# Patient Record
Sex: Female | Born: 1975 | Hispanic: No | Marital: Married | Smoking: Current every day smoker
Health system: Southern US, Community
[De-identification: ages and names within clinical notes are randomized; demographics above are authoritative.]

## PROBLEM LIST (undated history)

## (undated) DIAGNOSIS — R569 Unspecified convulsions: Secondary | ICD-10-CM

---

## 2002-06-05 ENCOUNTER — Encounter: Payer: Self-pay | Admitting: Emergency Medicine

## 2002-06-05 ENCOUNTER — Emergency Department (HOSPITAL_COMMUNITY): Admission: EM | Admit: 2002-06-05 | Discharge: 2002-06-05 | Payer: Self-pay | Admitting: Emergency Medicine

## 2004-04-14 ENCOUNTER — Other Ambulatory Visit: Admission: RE | Admit: 2004-04-14 | Discharge: 2004-04-14 | Payer: Self-pay | Admitting: Gynecology

## 2004-10-19 ENCOUNTER — Inpatient Hospital Stay (HOSPITAL_COMMUNITY): Admission: AD | Admit: 2004-10-19 | Discharge: 2004-10-22 | Payer: Self-pay | Admitting: Obstetrics

## 2018-07-16 ENCOUNTER — Emergency Department (HOSPITAL_COMMUNITY): Payer: Self-pay

## 2018-07-16 ENCOUNTER — Emergency Department (HOSPITAL_COMMUNITY)
Admission: EM | Admit: 2018-07-16 | Discharge: 2018-07-16 | Disposition: A | Discharge status: Home / Self Care | Payer: Self-pay | Attending: Emergency Medicine | Admitting: Emergency Medicine

## 2018-07-16 ENCOUNTER — Encounter (HOSPITAL_COMMUNITY): Payer: Self-pay | Admitting: Emergency Medicine

## 2018-07-16 ENCOUNTER — Other Ambulatory Visit: Payer: Self-pay

## 2018-07-16 DIAGNOSIS — F17210 Nicotine dependence, cigarettes, uncomplicated: Secondary | ICD-10-CM | POA: Insufficient documentation

## 2018-07-16 DIAGNOSIS — G40909 Epilepsy, unspecified, not intractable, without status epilepticus: Secondary | ICD-10-CM | POA: Insufficient documentation

## 2018-07-16 DIAGNOSIS — R569 Unspecified convulsions: Secondary | ICD-10-CM

## 2018-07-16 HISTORY — DX: Unspecified convulsions: R56.9

## 2018-07-16 LAB — URINALYSIS, ROUTINE W REFLEX MICROSCOPIC
BACTERIA UA: NONE SEEN
Bilirubin Urine: NEGATIVE
Glucose, UA: NEGATIVE mg/dL
Ketones, ur: 5 mg/dL — AB
Leukocytes, UA: NEGATIVE
NITRITE: NEGATIVE
PH: 5 (ref 5.0–8.0)
Protein, ur: NEGATIVE mg/dL
Specific Gravity, Urine: 1.02 (ref 1.005–1.030)

## 2018-07-16 LAB — CBC WITH DIFFERENTIAL/PLATELET
Abs Immature Granulocytes: 0 10*3/uL (ref 0.0–0.1)
BASOS ABS: 0.1 10*3/uL (ref 0.0–0.1)
BASOS PCT: 1 %
EOS ABS: 0.1 10*3/uL (ref 0.0–0.7)
EOS PCT: 1 %
HEMATOCRIT: 48.6 % — AB (ref 36.0–46.0)
Hemoglobin: 15.5 g/dL — ABNORMAL HIGH (ref 12.0–15.0)
Immature Granulocytes: 0 %
LYMPHS ABS: 1.8 10*3/uL (ref 0.7–4.0)
Lymphocytes Relative: 18 %
MCH: 32.2 pg (ref 26.0–34.0)
MCHC: 31.9 g/dL (ref 30.0–36.0)
MCV: 101 fL — ABNORMAL HIGH (ref 78.0–100.0)
Monocytes Absolute: 0.8 10*3/uL (ref 0.1–1.0)
Monocytes Relative: 8 %
NEUTROS PCT: 72 %
Neutro Abs: 7.3 10*3/uL (ref 1.7–7.7)
PLATELETS: 226 10*3/uL (ref 150–400)
RBC: 4.81 MIL/uL (ref 3.87–5.11)
RDW: 14.3 % (ref 11.5–15.5)
WBC: 10 10*3/uL (ref 4.0–10.5)

## 2018-07-16 LAB — COMPREHENSIVE METABOLIC PANEL
ALK PHOS: 57 U/L (ref 38–126)
ALT: 14 U/L (ref 0–44)
ANION GAP: 10 (ref 5–15)
AST: 22 U/L (ref 15–41)
Albumin: 3.8 g/dL (ref 3.5–5.0)
BILIRUBIN TOTAL: 0.8 mg/dL (ref 0.3–1.2)
BUN: 9 mg/dL (ref 6–20)
CALCIUM: 9.3 mg/dL (ref 8.9–10.3)
CO2: 25 mmol/L (ref 22–32)
Chloride: 105 mmol/L (ref 98–111)
Creatinine, Ser: 0.77 mg/dL (ref 0.44–1.00)
GFR calc Af Amer: >60 mL/min (ref >60)
Glucose, Bld: 111 mg/dL — ABNORMAL HIGH (ref 70–99)
POTASSIUM: 3.6 mmol/L (ref 3.5–5.1)
Sodium: 140 mmol/L (ref 135–145)
TOTAL PROTEIN: 7.1 g/dL (ref 6.5–8.1)

## 2018-07-16 LAB — WET PREP, GENITAL
Clue Cells Wet Prep HPF POC: NONE SEEN
Sperm: NONE SEEN
TRICH WET PREP: NONE SEEN
WBC, Wet Prep HPF POC: NONE SEEN
Yeast Wet Prep HPF POC: NONE SEEN

## 2018-07-16 LAB — I-STAT BETA HCG BLOOD, ED (MC, WL, AP ONLY)

## 2018-07-16 LAB — RAPID URINE DRUG SCREEN, HOSP PERFORMED
Amphetamines: NOT DETECTED
Barbiturates: NOT DETECTED
Benzodiazepines: NOT DETECTED
COCAINE: NOT DETECTED
OPIATES: NOT DETECTED
TETRAHYDROCANNABINOL: POSITIVE — AB

## 2018-07-16 LAB — CBG MONITORING, ED: GLUCOSE-CAPILLARY: 94 mg/dL (ref 70–99)

## 2018-07-16 LAB — LIPASE, BLOOD: LIPASE: 26 U/L (ref 11–51)

## 2018-07-16 LAB — ETHANOL: Alcohol, Ethyl (B): <10 mg/dL (ref <10)

## 2018-07-16 NOTE — ED Triage Notes (Signed)
Pt presents to ED for assessment after having a witnessed seizure, where she fell from standing after arriving home from work, hit the back of her head on the counter, and is now experiencing generalized body pain from the fall.  Patient states hx of 1 other seizure 4 years ago.  Patient states she drinks 3 large malt liquor beverages a day, and her last drink prior to the seizure was the night before.  EMS assessed patient, but she adamantly refused transport last night.  Patient states she does not remember any of this.  Husband speaking over patient and for patient in triage.  When asked if patient felt safe in her relationships patient states "ummm, yeah, I guess".

## 2018-07-16 NOTE — Discharge Instructions (Signed)
1.  No driving until you have been seen by neurology and cleared as safe for driving. 2.  Try to get regular sleep 8 hours a night.  Eat 3-4 small healthy meals throughout the day.  Avoid alcohol, drugs and smoking.  Alcohol and drugs can trigger seizures in people who are possibly more susceptible.  Try to reduce stress as much as possible. 3.  Use the referral number in your discharge instructions to find a family doctor.  You should see a family doctor and also follow-up with neurology.  A [facility] for further seizure evaluation.  Many specialists require a family doctor for referrals and routine care.

## 2018-07-16 NOTE — ED Notes (Signed)
CBG 94 

## 2018-07-16 NOTE — ED Notes (Signed)
Pt alert and oriented in NAD. Pt verbalized understanding of discharge instructions. 

## 2018-07-16 NOTE — ED Provider Notes (Signed)
[facility] EM[facility]GENC[facility] Provider Note   CSN: [id number] Arrival date & time: 07/16/18  1231     History   Chief Complaint Chief Complaint  Patient presents with  . Seizures  . Head Injury    HPI Laura Arias is a 42 y.o. female.  HPI Patient was home from work yesterday evening.  She saw her husband.  Shortly thereafter she had a seizure.  She does not recall events surrounding the seizure.  Her husband reports that she fell in the kitchen and hit the back of her head on the countertop.  She was having seizure activity with shaking of her extremities.  He reports that she was foaming at the mouth.  This lasted approximately 2 minutes.  Patient then reports awakening to the EMS personnel surrounding her.  EMS evaluated the patient and her vital signs were stable.  Her mental status returned to normal.  Refused transport.  She reports that she agreed to come in this morning because she feels extremely fatigued and washed out.  She feels achy.  She did have a seizure episode about 4 years ago.  She reports that she had an MRI and EEG done at that time.  No etiology was identified.  She was not advised to take anticonvulsant medications.  Patient works in [employer].  She apparently has fairly late hours.  She reports that she had not slept very well the previous evening.  She typically drinks about #2 40 ounce beers per day.  She denies she is ever had withdrawal.  She does not get tremor or shaking if she stops drinking.  She reports that does have her make it harder for her to sleep at night.  She reports she has been getting sweats and chills at night.  This is been going on for a number of months.  Thought maybe is because she is perimenopausal.  She reports she might be chilly and get all bundled up in the blankets and then break out in a sweat.  He denies this occurs during the daytime.  She does not have fevers.  She reports she has had a little bit of weight  gain.  Family history: She reports her mother died from "dementia" in her 260s.  Per the patient this dementia was a result of severe depression.  She describes her father as having died of something similar. Past Medical History:  Diagnosis Date  . Seizures (HCC)     There are no active problems to display for this patient.   Past Surgical History:  Procedure Laterality Date  . CESAREAN SECTION       OB History   None      Home Medications    Prior to Admission medications   Not on File    Family History History reviewed. No pertinent family history.  Social History Social History   Tobacco Use  . Smoking status: Current Every Day Smoker    Packs/day: 1.00  . Smokeless tobacco: Never Used  Substance Use Topics  . Alcohol use: Yes    Comment: drinks several large malt liquors a day  . Drug use: Never     Allergies   Codeine   Review of Systems Review of Systems 10 Systems reviewed and are negative for acute change except as noted in the HPI.  Physical Exam Updated Vital Signs BP 113/76   Pulse (!) 57   Temp 98.9 F (37.2 C) (Oral)   Resp (!) 22  LMP 05/31/2018   SpO2 99%   Physical Exam  Constitutional: She is oriented to person, place, and time. She appears well-developed and well-nourished. No distress.  HENT:  Head: Normocephalic and atraumatic.  Right Ear: External ear normal.  Left Ear: External ear normal.  Nose: Nose normal.  Mouth/Throat: Oropharynx is clear and moist.  Bilateral TMs normal.  Very small laceration to the lateral aspect of the right tongue.  Dentition in good condition.  Eyes: Pupils are equal, round, and reactive to light. EOM are normal.  Neck: Neck supple.  C-spine nontender to palpation.  Cardiovascular: Normal rate, regular rhythm, normal heart sounds and intact distal pulses.  Pulmonary/Chest: Effort normal and breath sounds normal.  Abdominal: Soft. She exhibits no distension and no mass. There is no  tenderness. There is no guarding.  Genitourinary:  Genitourinary Comments: Normal external female genitalia.  Speculum cervix normal appearance with small amount of white discharge.  Slight cervical friability and bleeding with small collection.  No cervical motion tenderness.  Uterus nontender no enlargement.  Bilateral adnexa nontender no enlargement.  Musculoskeletal: Normal range of motion. She exhibits no edema or tenderness.  Neurological: She is alert and oriented to person, place, and time. No cranial nerve deficit or sensory deficit. She exhibits normal muscle tone. Coordination normal.  No tremor.  Finger-nose exam normal.  Cognitive function normal.  Skin: Skin is warm and dry.  Psychiatric: She has a normal mood and affect.     ED Treatments / Results  Labs (all labs ordered are listed, but only abnormal results are displayed) Labs Reviewed  COMPREHENSIVE METABOLIC PANEL - Abnormal; Notable for the following components:      Result Value   Glucose, Bld 111 (*)    All other components within normal limits  CBC WITH DIFFERENTIAL/PLATELET - Abnormal; Notable for the following components:   Hemoglobin 15.5 (*)    HCT 48.6 (*)    MCV 101.0 (*)    All other components within normal limits  URINALYSIS, ROUTINE W REFLEX MICROSCOPIC - Abnormal; Notable for the following components:   Hgb urine dipstick MODERATE (*)    Ketones, ur 5 (*)    All other components within normal limits  RAPID URINE DRUG SCREEN, HOSP PERFORMED - Abnormal; Notable for the following components:   Tetrahydrocannabinol POSITIVE (*)    All other components within normal limits  WET PREP, GENITAL  ETHANOL  LIPASE, BLOOD  RPR  HIV ANTIBODY (ROUTINE TESTING)  CBG MONITORING, ED  I-STAT BETA HCG BLOOD, ED ([facility], [facility], [facility] ONLY)  GC/CHLAMYDIA PROBE AMP ([place]) NOT AT [facility]    EKG EKG Interpretation  Date/Time:  Sunday July 16 2018 14:27:28 EDT Ventricular Rate:  58 PR Interval:    QRS  Duration: 90 QT Interval:  426 QTC Calculation: 419 R Axis:   68 Text Interpretation:  Sinus rhythm Short PR interval normal, no old comparison Confirmed by Arby Barrette [phone]) on 07/16/2018 4:09:01 PM   Radiology Dg Chest 2 View  Result Date: 07/16/2018 CLINICAL DATA:  Witnessed seizure. Fall from standing position, hitting back of head. Generalized body pain. EXAM: CHEST - 2 VIEW COMPARISON:  None. FINDINGS: The heart size and mediastinal contours are normal. The lungs are clear. There is no pleural effusion or pneumothorax. No acute osseous findings are identified. There is a mild convex right scoliosis which may be positional. Telemetry leads overlie the chest. IMPRESSION: No active cardiopulmonary process. Electronically Signed   By: Hilarie Fredrickson.D.  On: 07/16/2018 14:35   Ct Head Wo Contrast  Result Date: 07/16/2018 CLINICAL DATA:  Seizure, new, nontraumatic, >40 yrs EXAM: CT HEAD WITHOUT CONTRAST TECHNIQUE: Contiguous axial images were obtained from the base of the skull through the vertex without intravenous contrast. COMPARISON:  None. FINDINGS: Brain: No evidence of acute infarction, hemorrhage, hydrocephalus, extra-axial collection or mass lesion/mass effect. Vascular: No hyperdense vessel or unexpected calcification. Skull: Normal. Negative for fracture or focal lesion. Sinuses/Orbits: No acute finding. Other: None. IMPRESSION: Negative exam. Electronically Signed   By: Norva Pavlov M.D.   On: 07/16/2018 14:30    Procedures Procedures (including critical care time)  Medications Ordered in ED Medications - No data to display   Initial Impression / Assessment and Plan / ED Course  I have reviewed the triage vital signs and the nursing notes.  Pertinent labs & imaging results that were available during my care of the patient were reviewed by me and considered in my medical decision making (see chart for details).     Final Clinical Impressions(s) / ED Diagnoses    Final diagnoses:  Seizure [facility])  Patient does not have diagnosed seizure disorder.  She reports one seizure 4 years ago and describes work-up including MRI and EEG.  He is otherwise healthy.  Patient had witnessed seizure activity by her husband yesterday evening.  He refused transport at that time.  She came in today because of achiness and fatigue.  Clinically she is nontoxic and has normal neurologic exam.  Gnostic work-up is within normal limits.  Patient described chills and sweats at night perception of mild lower abdominal bloating.  Denies significant pain.  Pelvic exam done based on the symptoms.  Normal exam.  No indication at this time of any empiric treatment for pelvic infection.  Cultures will be pending.  Patient does drink several large beers per day.  Her alcohol is negative.  She shows no objective signs of withdrawal.  I doubt alcohol withdrawal seizure given patient has negative blood alcohol level and no signs of withdrawal.  He also occasionally smokes marijuana.  Possibly with a combination of fatigue and absence of abuse lowering the threshold for seizure she has had one seizure in the past.  Patient is counseled that she may not drive.  She is given plan for follow-up with neurology for further evaluation to be cleared for seizure disorder.  ED Discharge Orders        Ordered    Ambulatory referral to Neurology    Comments:  An appointment is requested in approximately: 1 week   07/16/18 1610       Arby Barrette, MD 07/16/18 [phone]

## 2018-07-16 NOTE — ED Notes (Signed)
Patient transported to CT 

## 2018-07-17 LAB — HIV ANTIBODY (ROUTINE TESTING W REFLEX): HIV Screen 4th Generation wRfx: NONREACTIVE

## 2018-07-17 LAB — GC/CHLAMYDIA PROBE AMP (~~LOC~~) NOT AT ARMC
CHLAMYDIA, DNA PROBE: NEGATIVE
Neisseria Gonorrhea: NEGATIVE

## 2018-07-17 LAB — RPR: RPR Ser Ql: NONREACTIVE

## 2019-08-30 IMAGING — CR DG CHEST 2V
2 series · 2 of 2 positions shown · non-contrast
Comparison: None.

CLINICAL DATA: Witnessed seizure. Fall from standing position,
hitting back of head. Generalized body pain.

EXAM:
CHEST - 2 VIEW

[chest pa]
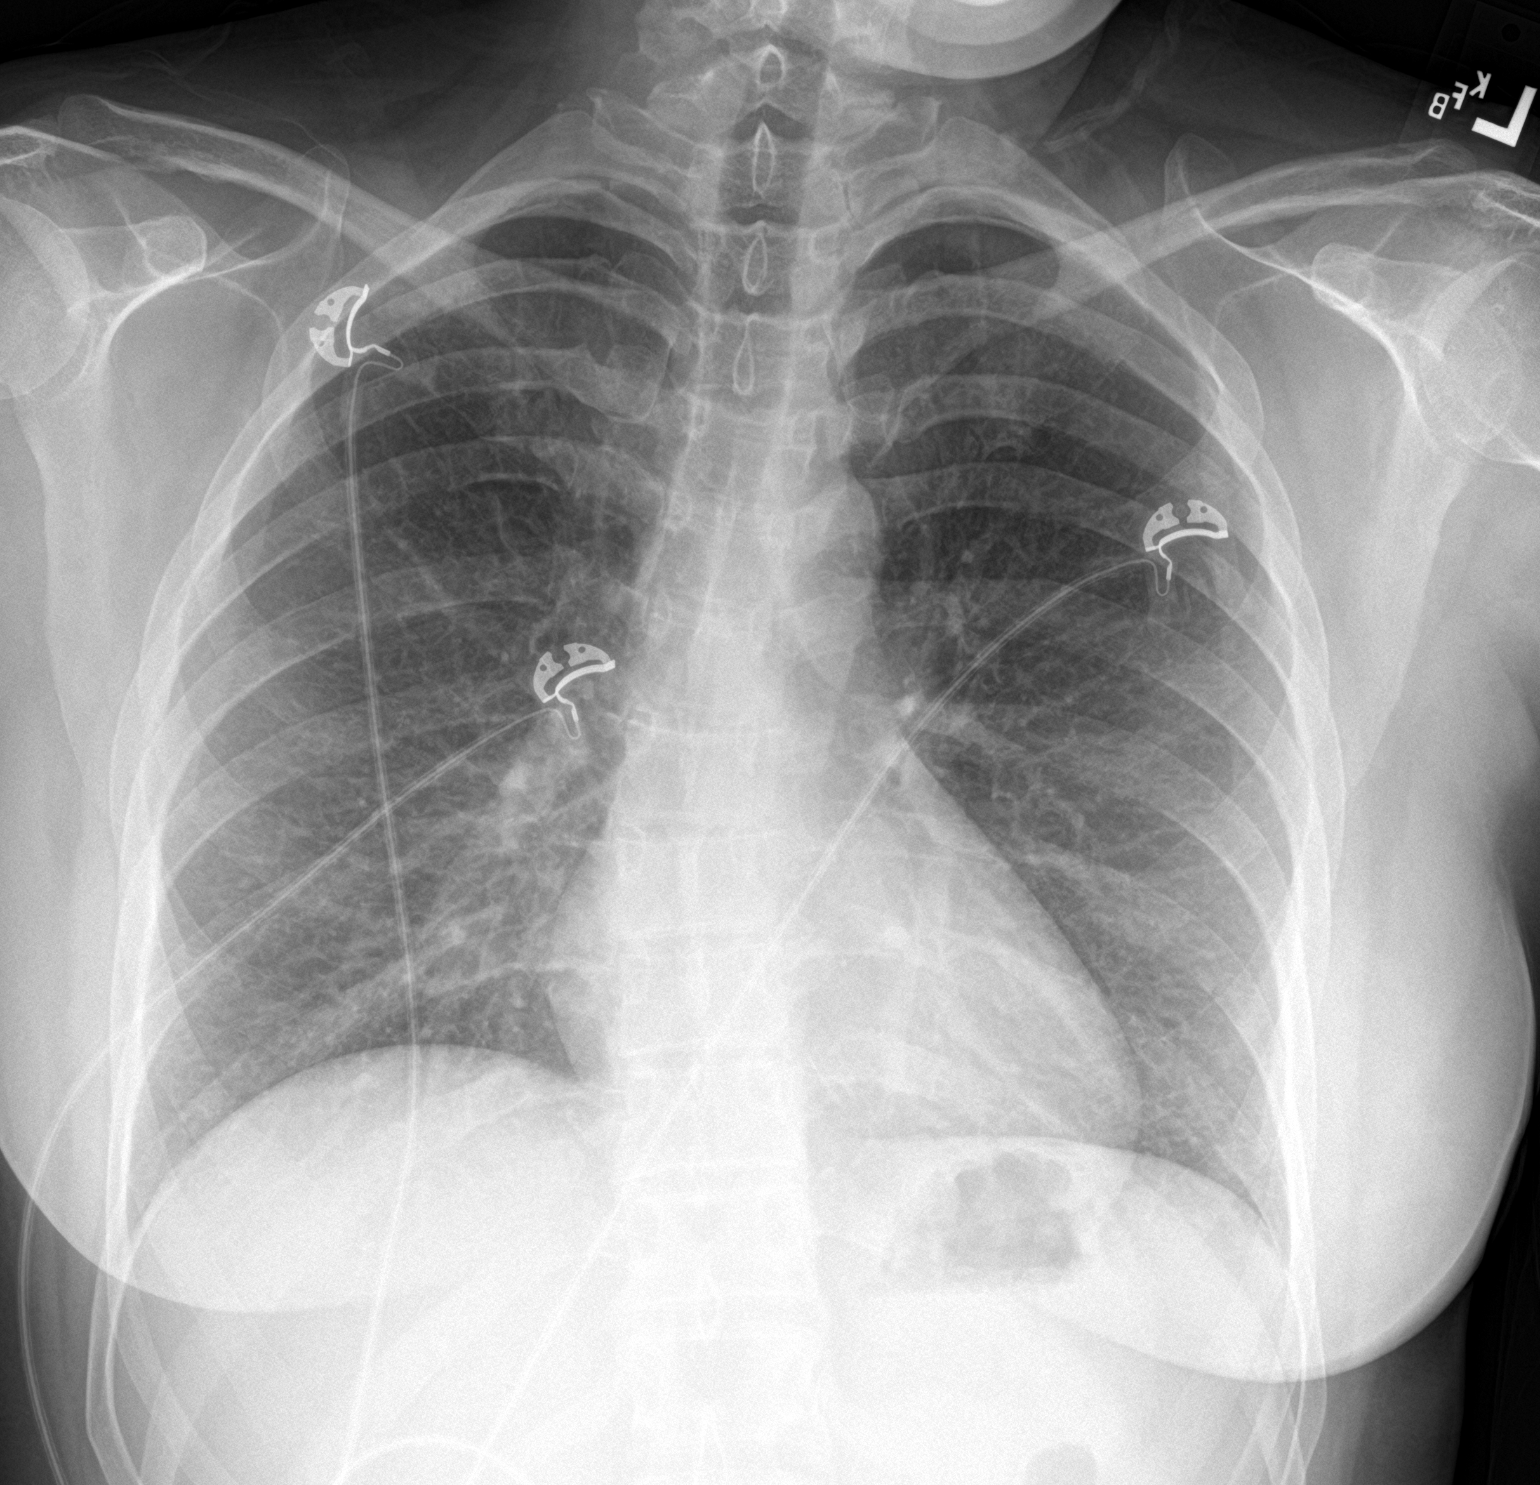

[chest lat]
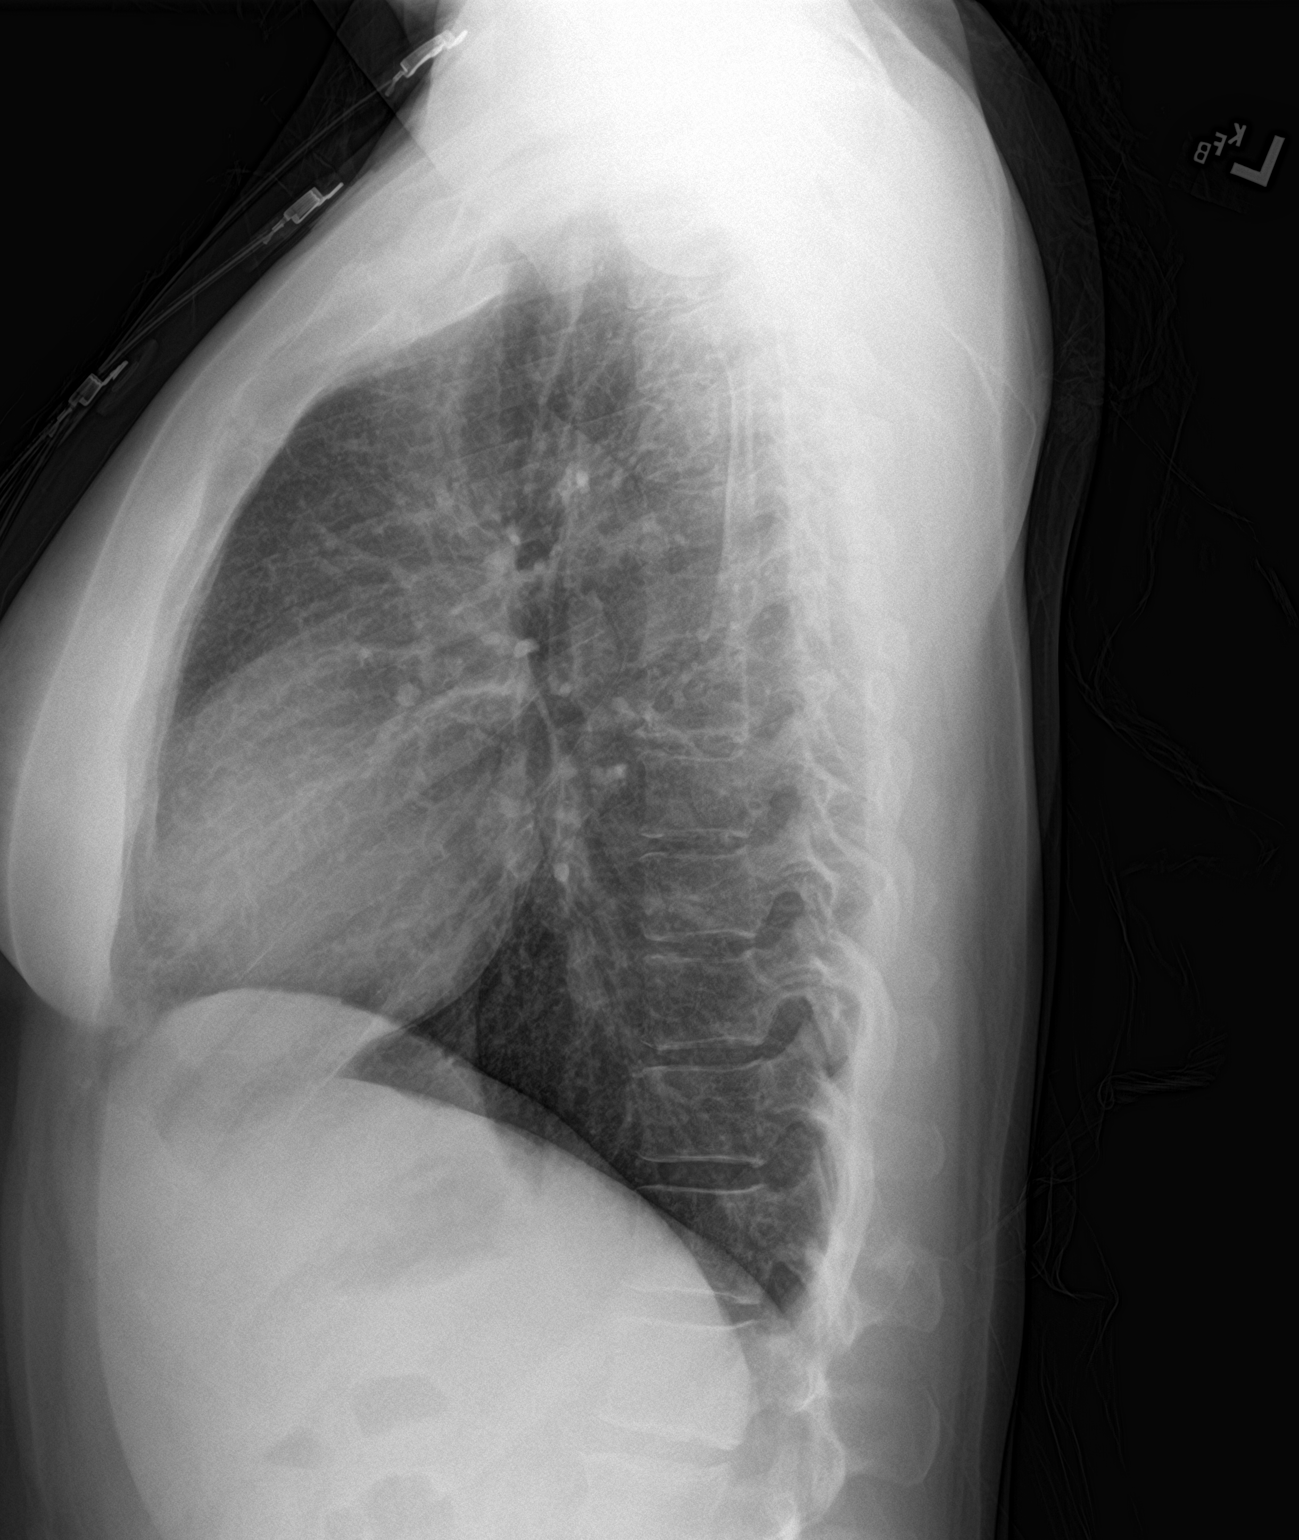

[2 of 2 positions shown; findings below may reference images not displayed]

FINDINGS: The heart size and mediastinal contours are normal. The lungs are
clear. There is no pleural effusion or pneumothorax. No acute
osseous findings are identified. There is a mild convex right
scoliosis which may be positional. Telemetry leads overlie the
chest.
IMPRESSION: No active cardiopulmonary process.

## 2019-08-30 IMAGING — CT CT HEAD W/O CM
4 series · 17 of 47 positions shown, 19 images · non-contrast
Comparison: None.

CLINICAL DATA: Seizure, new, nontraumatic, >40 yrs

EXAM:
CT HEAD WITHOUT CONTRAST
TECHNIQUE: Contiguous axial images were obtained from the base of the skull
through the vertex without intravenous contrast.

[Series 3: head without · axial · non-contrast · 0.46mm/px · z∈[+653,+773]mm · 7 of 33 slices shown, 9 images]
[im 5/33  brain]
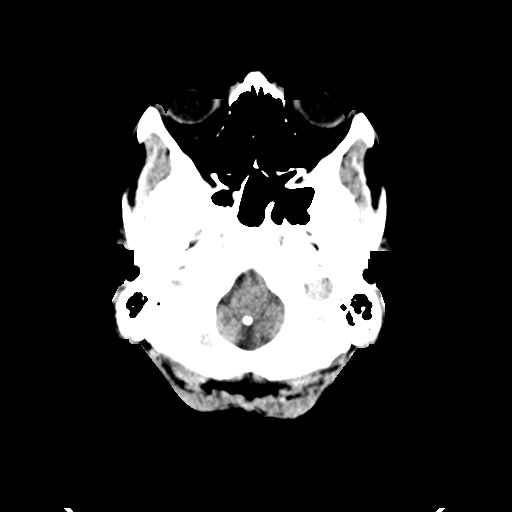
[im 5/33  bone]
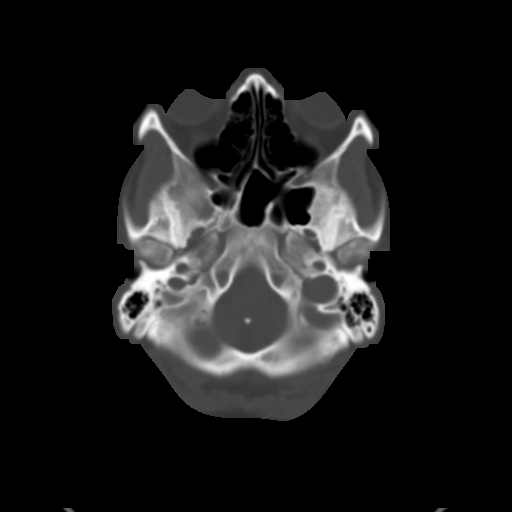
[im 9/33  brain]
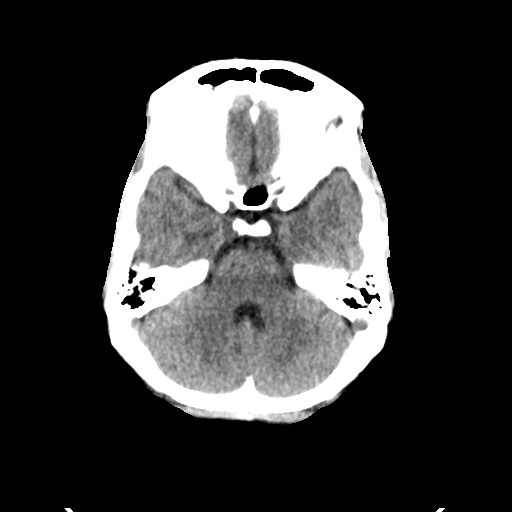
[im 13/33  brain]
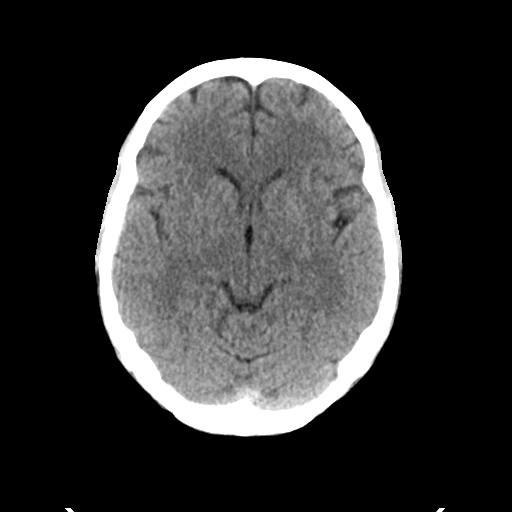
[im 17/33  brain]
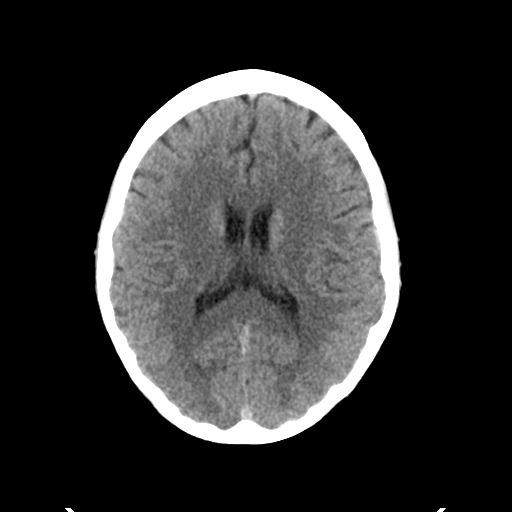
[im 21/33  brain]
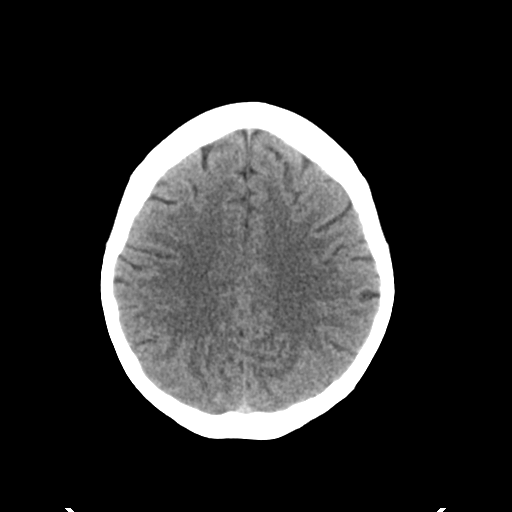
[im 21/33  bone]
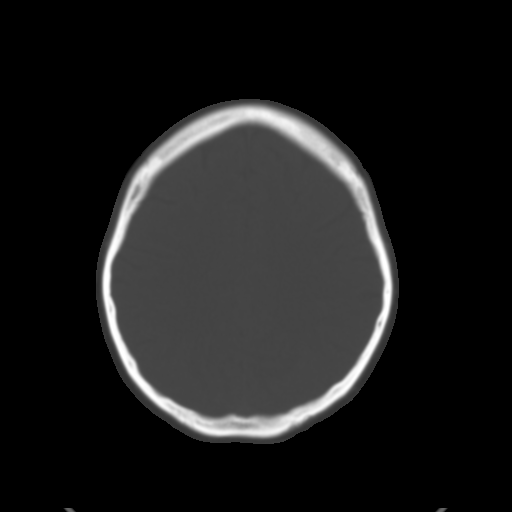
[im 25/33  brain]
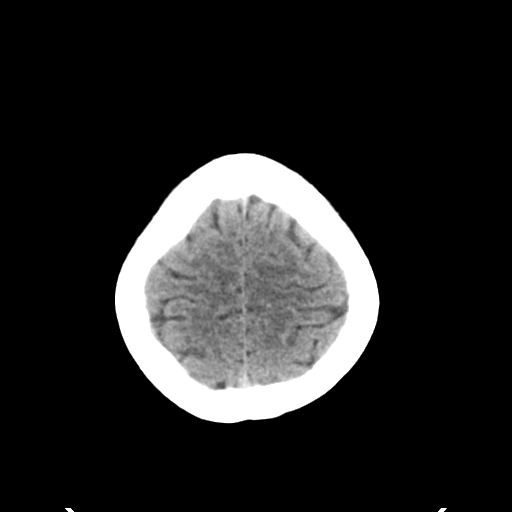
[im 29/33  brain]
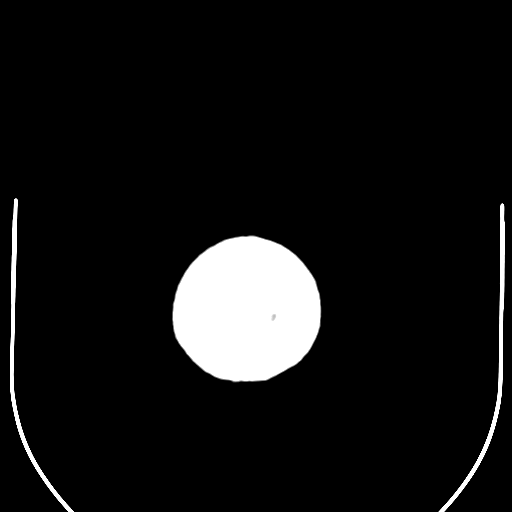

[Series 4: head bone · axial · 0.46mm/px · z∈[+649,+705]mm · 4 of 81 slices shown]
[im 9/81  bone]
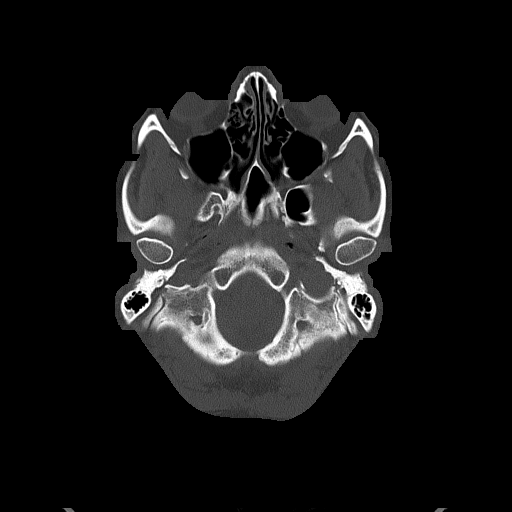
[im 17/81  bone]
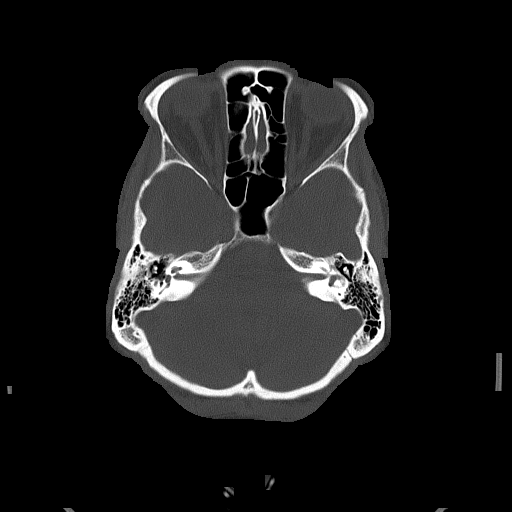
[im 25/81  bone]
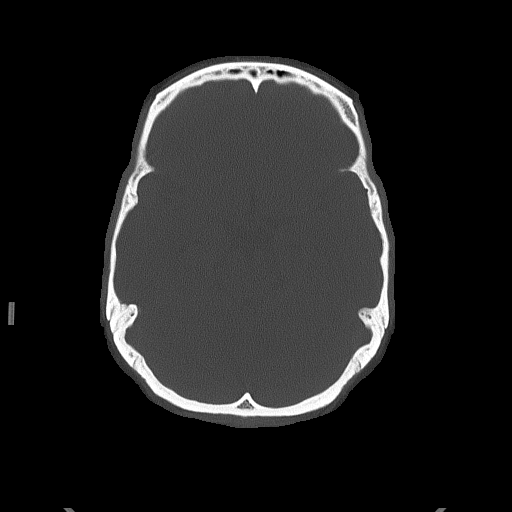
[im 37/81  bone]
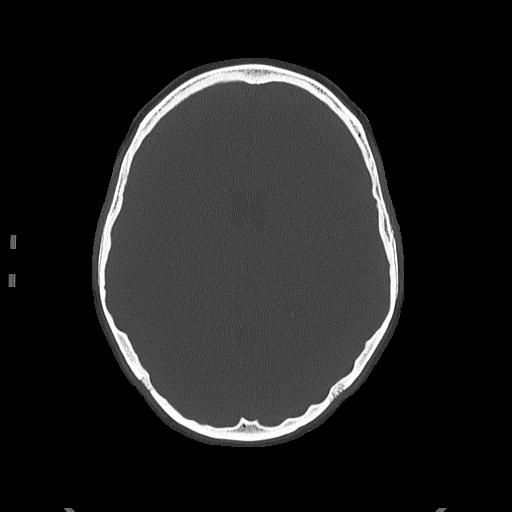

[Series 5: head without cor · coronal · non-contrast · 0.33mm/px · 3 of 64 slices shown]
[im 22/64  brain]
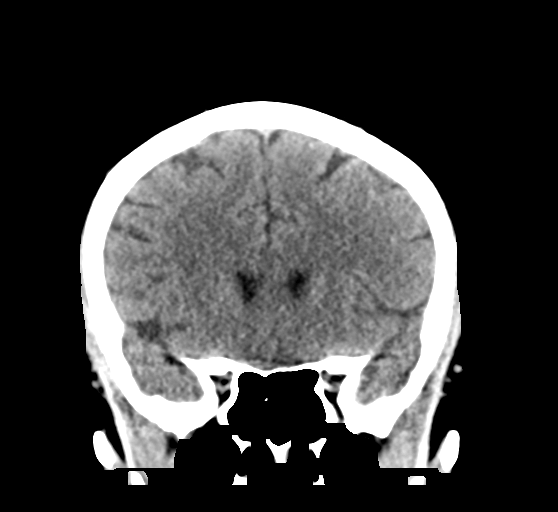
[im 29/64  brain]
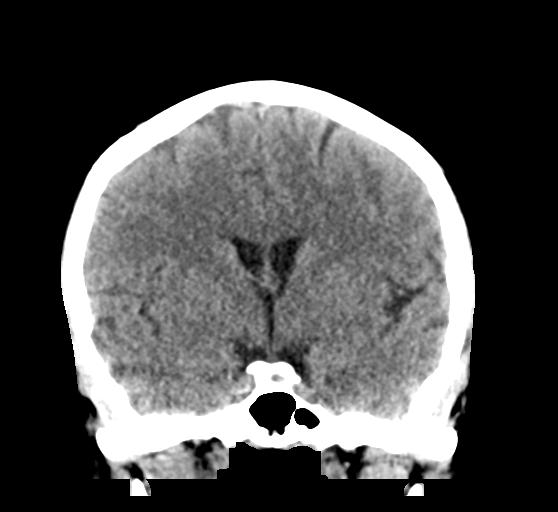
[im 36/64  brain]
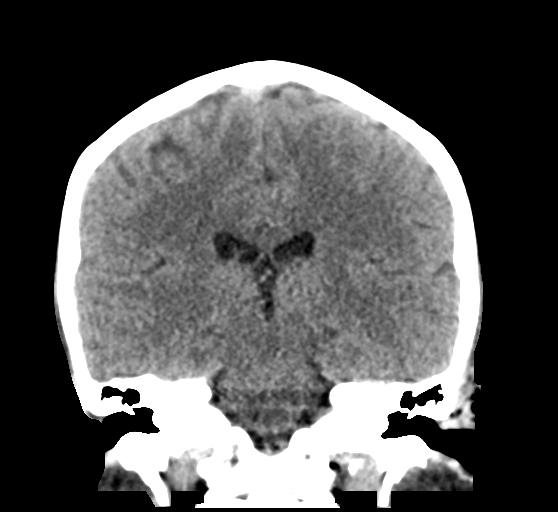

[Series 6: head without sag · sagittal · non-contrast · 0.35mm/px · 3 of 67 slices shown]
[im 23/67  brain]
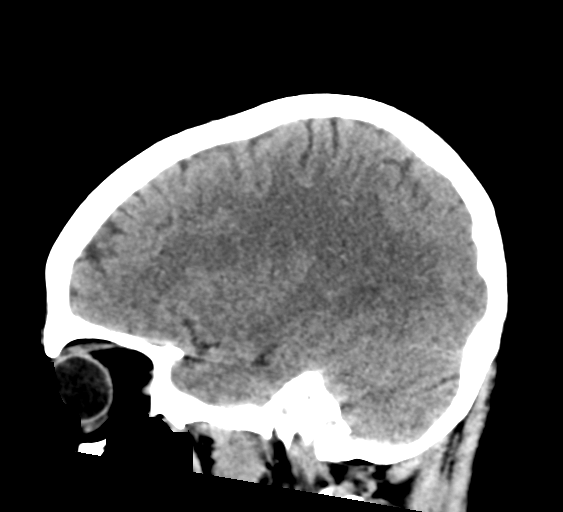
[im 34/67  brain]
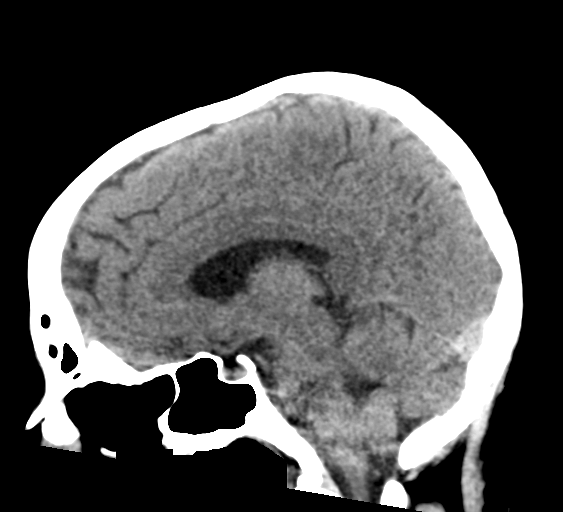
[im 45/67  brain]
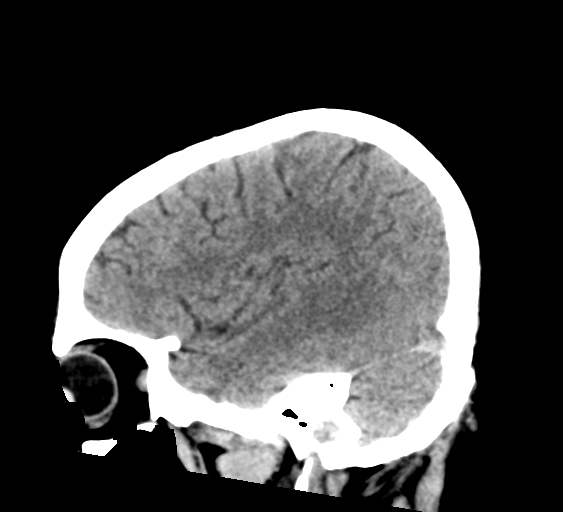

[17 of 47 positions shown; findings below may reference images not displayed]

FINDINGS: Brain: No evidence of acute infarction, hemorrhage, hydrocephalus,
extra-axial collection or mass lesion/mass effect.

Vascular: No hyperdense vessel or unexpected calcification.

Skull: Normal. Negative for fracture or focal lesion.

Sinuses/Orbits: No acute finding.

Other: None.
IMPRESSION: Negative exam.
